# Patient Record
Sex: Female | Born: 1985 | Race: White | Hispanic: No | Marital: Married | Smoking: Never smoker
Health system: Southern US, Community
[De-identification: ages and names within clinical notes are randomized; demographics above are authoritative.]

## PROBLEM LIST (undated history)

## (undated) HISTORY — PX: ORTHOPEDIC SURGERY: SHX850

---

## 2019-11-11 ENCOUNTER — Ambulatory Visit (INDEPENDENT_AMBULATORY_CARE_PROVIDER_SITE_OTHER): Payer: PRIVATE HEALTH INSURANCE | Admitting: Certified Nurse Midwife

## 2019-11-11 ENCOUNTER — Other Ambulatory Visit: Payer: Self-pay

## 2019-11-11 ENCOUNTER — Encounter: Payer: Self-pay | Admitting: Certified Nurse Midwife

## 2019-11-11 VITALS — BP 133/86 | HR 122 | Ht 62.0 in | Wt 124.0 lb

## 2019-11-11 DIAGNOSIS — N911 Secondary amenorrhea: Secondary | ICD-10-CM

## 2019-11-11 DIAGNOSIS — Z3202 Encounter for pregnancy test, result negative: Secondary | ICD-10-CM | POA: Diagnosis not present

## 2019-11-11 DIAGNOSIS — Z3200 Encounter for pregnancy test, result unknown: Secondary | ICD-10-CM

## 2019-11-11 LAB — POCT URINE PREGNANCY: Preg Test, Ur: NEGATIVE

## 2019-11-11 NOTE — Progress Notes (Signed)
Pt had 1 positive UPT at home and then had several negative UPT after that UPT negative in office today

## 2019-11-12 ENCOUNTER — Ambulatory Visit (INDEPENDENT_AMBULATORY_CARE_PROVIDER_SITE_OTHER): Payer: PRIVATE HEALTH INSURANCE

## 2019-11-12 DIAGNOSIS — N911 Secondary amenorrhea: Secondary | ICD-10-CM

## 2019-11-12 LAB — COMPREHENSIVE METABOLIC PANEL
AG Ratio: 2 (calc) (ref 1.0–2.5)
ALT: 12 U/L (ref 6–29)
AST: 13 U/L (ref 10–30)
Albumin: 4.6 g/dL (ref 3.6–5.1)
Alkaline phosphatase (APISO): 86 U/L (ref 31–125)
BUN: 13 mg/dL (ref 7–25)
CO2: 28 mmol/L (ref 20–32)
Calcium: 10.2 mg/dL (ref 8.6–10.2)
Chloride: 108 mmol/L (ref 98–110)
Creat: 0.69 mg/dL (ref 0.50–1.10)
Globulin: 2.3 g/dL (calc) (ref 1.9–3.7)
Glucose, Bld: 136 mg/dL — ABNORMAL HIGH (ref 65–99)
Potassium: 4 mmol/L (ref 3.5–5.3)
Sodium: 144 mmol/L (ref 135–146)
Total Bilirubin: 0.5 mg/dL (ref 0.2–1.2)
Total Protein: 6.9 g/dL (ref 6.1–8.1)

## 2019-11-12 LAB — TSH: TSH: 2.79 mIU/L

## 2019-11-12 LAB — PROLACTIN: Prolactin: 17.6 ng/mL

## 2019-11-12 LAB — HCG, QUANTITATIVE, PREGNANCY: HCG, Total, QN: <3 m[IU]/mL

## 2019-11-12 LAB — FOLLICLE STIMULATING HORMONE: FSH: 11.1 m[IU]/mL

## 2019-11-12 NOTE — Progress Notes (Signed)
History:  Ms. Melody Dunn is a 34 y.o. G1P1001 who presents to clinic today for pregnancy confirmation. Patient reports that is currently breastfeeding 75 month old daughter and has not had period since delivery of first child. Currently not on birth control. Patient reports having a +HPT March 26th. Took HPT after having pregnancy symptoms of changes in milk composition, changes in breast, bloating/cramping, fatigue, breast sensitivity, nausea and increased urination.   Patient reports retaking the HPT 3-4 days after the initial one prior to telling husband and that pregnancy test was negative. She reports taking multiple HPT after which have been negative.   Patient reports a history of the same situation with daughter with having a home positive then negative urine test and blood HCG level in office, then additional home positive and was 7 1/2 weeks by ultrasound.   The following portions of the patient's history were reviewed and updated as appropriate: allergies, current medications, family history, past medical history, social history, past surgical history and problem list.  Review of Systems:  Review of Systems  Constitutional: Positive for malaise/fatigue.  Respiratory: Negative.   Cardiovascular: Negative.   Gastrointestinal: Positive for abdominal pain and nausea.       Bloating and cramping  Genitourinary: Positive for frequency.  Neurological: Negative.   Psychiatric/Behavioral: Negative.      Objective:  Physical Exam BP 133/86   Pulse (!) 122   Ht 5' 2"  (1.575 m)   Wt 124 lb (56.2 kg)   BMI 22.68 kg/m  Physical Exam HENT:     Head: Normocephalic.  Cardiovascular:     Rate and Rhythm: Normal rate and regular rhythm.  Pulmonary:     Effort: Pulmonary effort is normal. No respiratory distress.     Breath sounds: Normal breath sounds. No wheezing.  Skin:    General: Skin is warm and dry.  Neurological:     Mental Status: She is alert and oriented to person, place,  and time.  Psychiatric:        Mood and Affect: Mood normal.        Behavior: Behavior normal.        Thought Content: Thought content normal.     Assessment & Plan:  1. Unconfirmed pregnancy - negative urine pregnancy test in office today  - B-HCG Quant  2. Urine pregnancy test negative - UPT negative  - POCT urine pregnancy  3. Secondary amenorrhea - Will assess for abnormalities due to secondary amenorrhea  - Based on history will obtain pelvic ultrasound  - if all labs normal/negative and [place] normal then will discuss progesterone challenge with patient  - TSH - Prolactin - FSH - Comp Met (CMET) - US Pelvis Complete; Future - B-HCG Sheral Flow, CNM 11/11/2019 4:32 PM

## 2021-11-27 IMAGING — US US PELVIS COMPLETE
1 series · 14 of 25 positions shown · non-contrast
Comparison: None available.

CLINICAL DATA: Initial evaluation for secondary amenorrhea.

EXAM:
TRANSABDOMINAL ULTRASOUND OF PELVIS
TECHNIQUE: Transabdominal ultrasound examination of the pelvis was performed
including evaluation of the uterus, ovaries, adnexal regions, and
pelvic cul-de-sac.

[Series 1: us pelvis complete · 0.24mm/px · 14 of 47 slices shown]
[im 1/47]
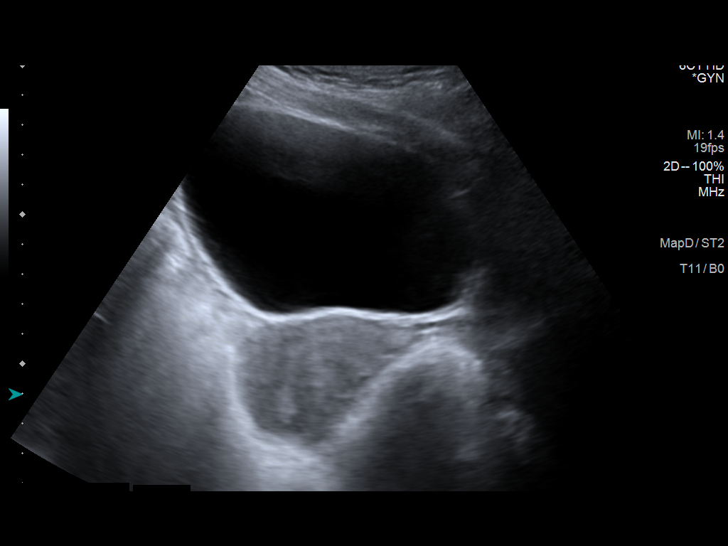
[im 4/47]
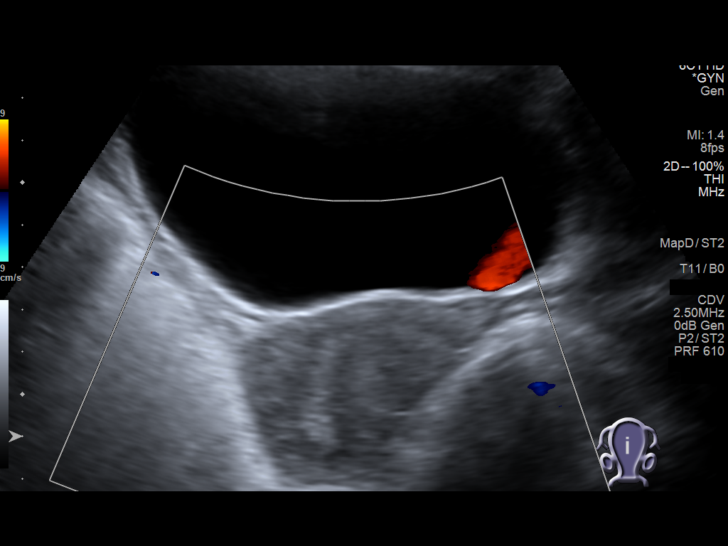
[im 8/47]
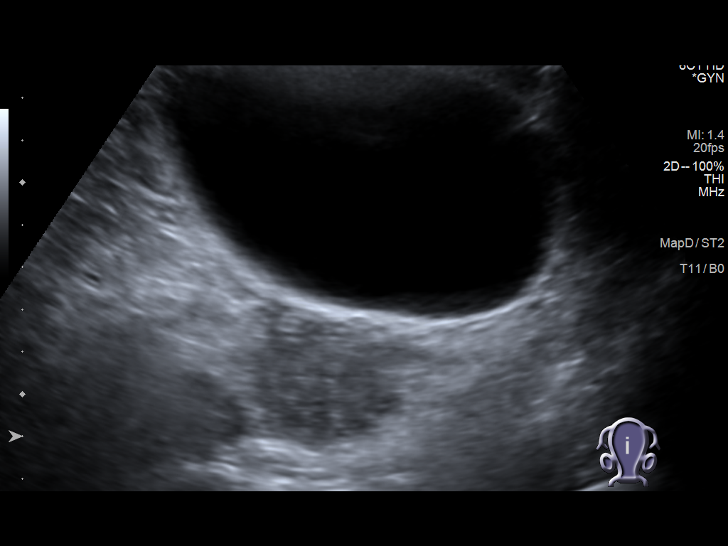
[im 12/47]
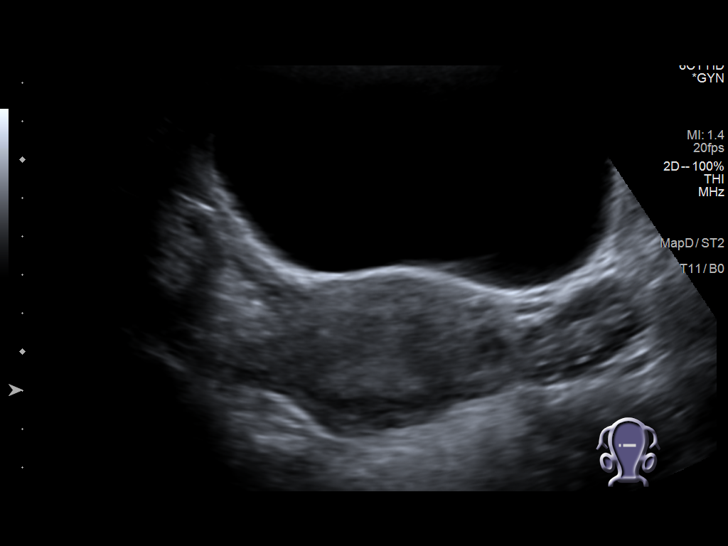
[im 16/47]
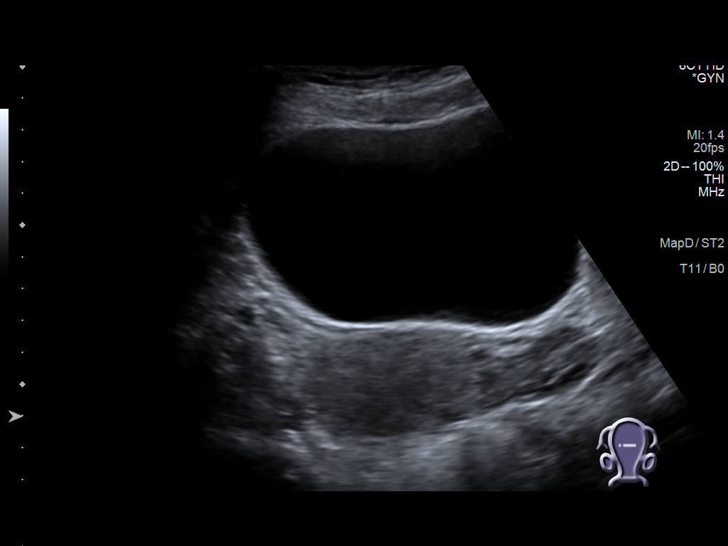
[im 18/47]
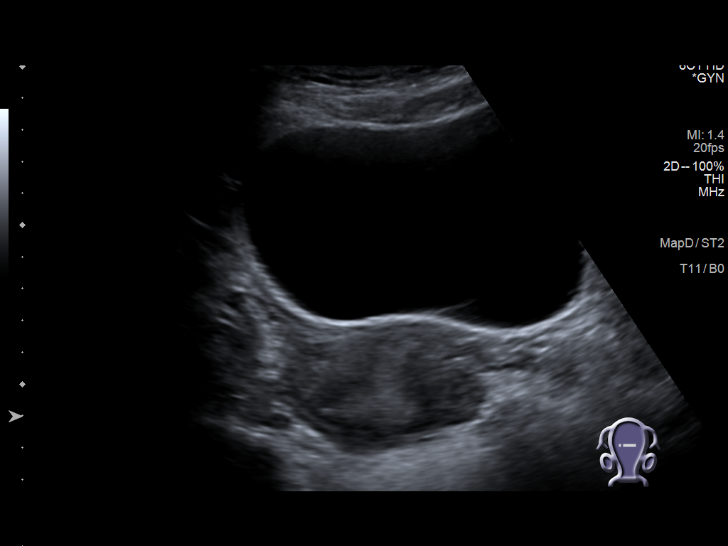
[im 22/47]
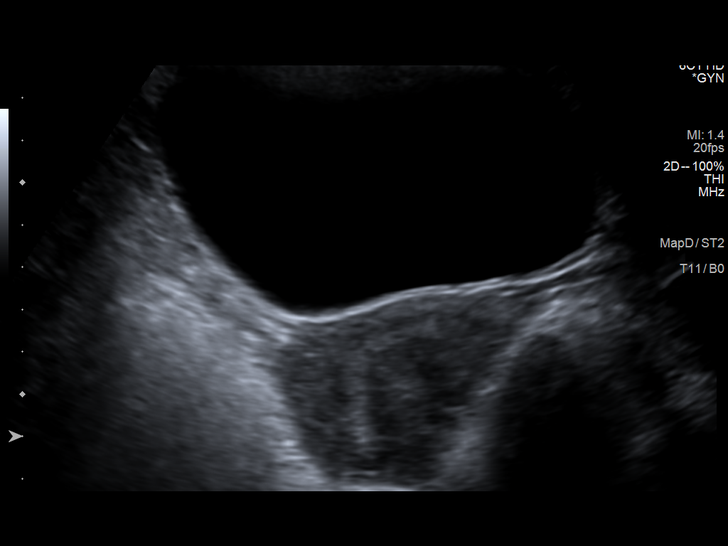
[im 25/47]
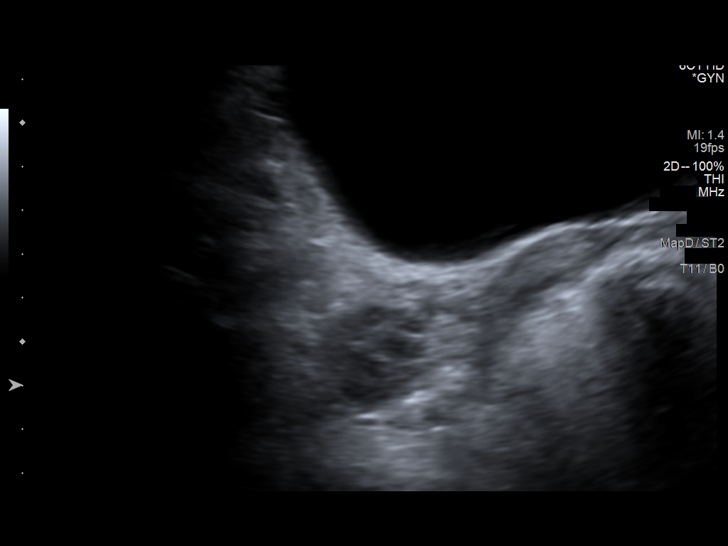
[im 29/47]
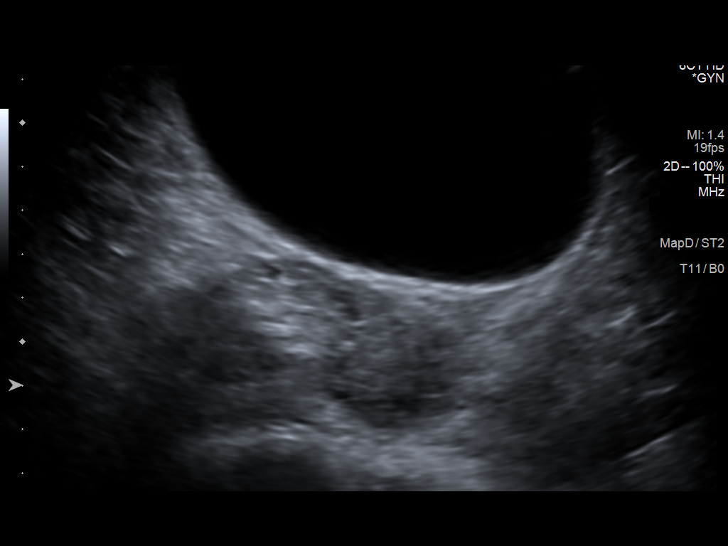
[im 31/47]
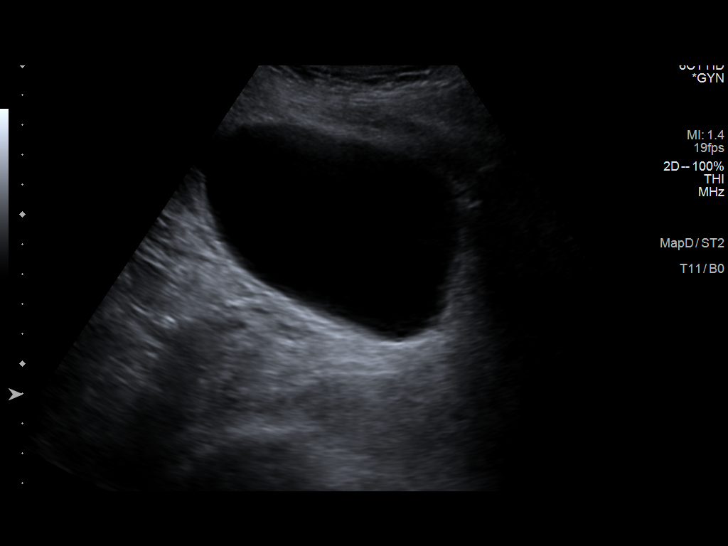
[im 35/47]
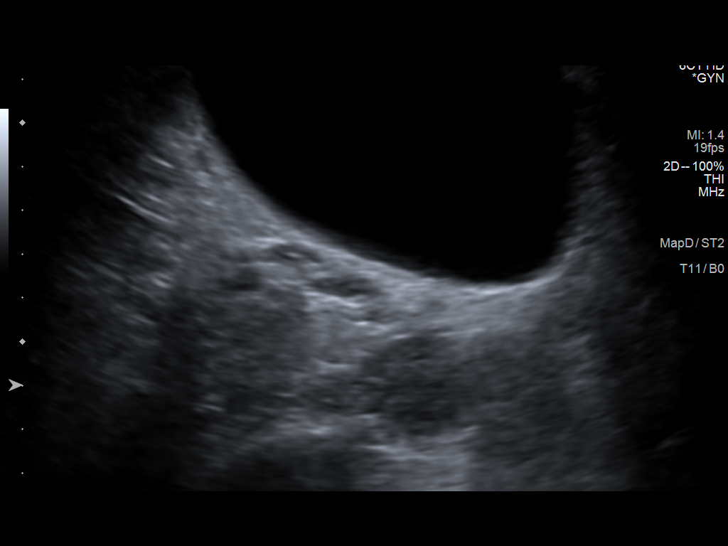
[im 39/47]
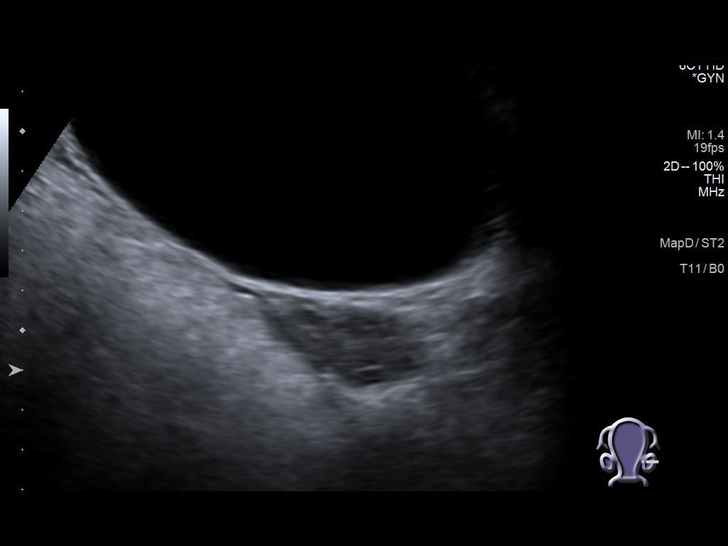
[im 43/47]
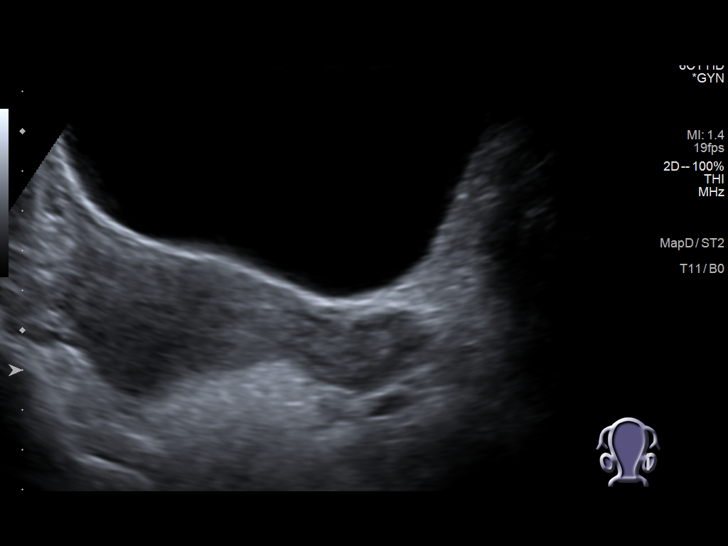
[im 47/47]
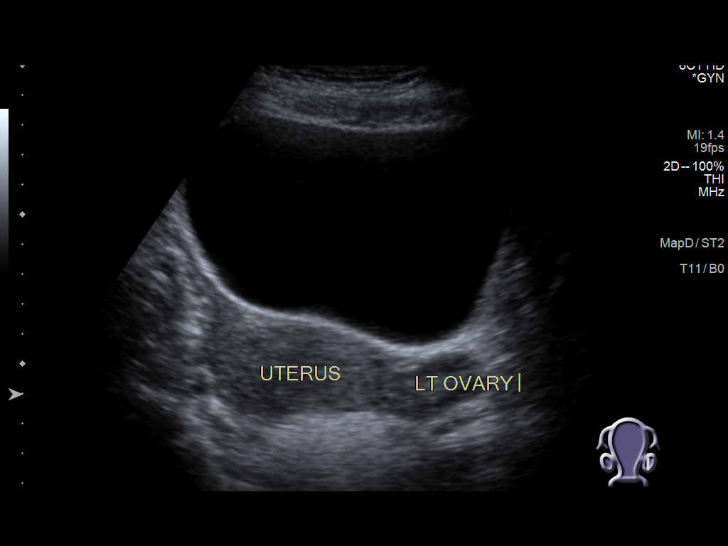

[14 of 25 positions shown; findings below may reference images not displayed]

FINDINGS: Uterus

Measurements: 5.6 x 3.9 x 5.7 cm = volume: 65 mL. No fibroids or
other mass visualized.

Endometrium

Thickness: 5.7 mm.  No focal abnormality visualized.

Right ovary

Measurements: 3.7 x 2.6 x 2.6 cm = volume: 13 mL. Normal
appearance/no adnexal mass.

Left ovary

Measurements: 3.5 x 1.8 x 3.0 cm = volume: 10 mL. Normal
appearance/no adnexal mass.

Other findings:  No abnormal free fluid.
IMPRESSION: Normal pelvic ultrasound.
